# Patient Record
Sex: Female | Born: 1960 | Race: White | Hispanic: No | Smoking: Current every day smoker
Health system: Southern US, Community
[De-identification: ages and names within clinical notes are randomized; demographics above are authoritative.]

## PROBLEM LIST (undated history)

## (undated) DIAGNOSIS — F419 Anxiety disorder, unspecified: Secondary | ICD-10-CM

## (undated) DIAGNOSIS — I059 Rheumatic mitral valve disease, unspecified: Secondary | ICD-10-CM

## (undated) DIAGNOSIS — I10 Essential (primary) hypertension: Secondary | ICD-10-CM

## (undated) HISTORY — DX: Essential (primary) hypertension: I10

## (undated) HISTORY — DX: Anxiety disorder, unspecified: F41.9

## (undated) HISTORY — DX: Rheumatic mitral valve disease, unspecified: I05.9

---

## 2001-01-29 ENCOUNTER — Other Ambulatory Visit: Admission: RE | Admit: 2001-01-29 | Discharge: 2001-01-29 | Payer: Self-pay | Admitting: *Deleted

## 2013-01-16 ENCOUNTER — Encounter (HOSPITAL_COMMUNITY): Payer: Self-pay | Admitting: Emergency Medicine

## 2013-01-16 ENCOUNTER — Emergency Department (INDEPENDENT_AMBULATORY_CARE_PROVIDER_SITE_OTHER)
Admission: EM | Admit: 2013-01-16 | Discharge: 2013-01-16 | Disposition: A | Discharge status: Home / Self Care | Payer: Worker's Compensation | Source: Home / Self Care | Attending: Emergency Medicine | Admitting: Emergency Medicine

## 2013-01-16 ENCOUNTER — Emergency Department (INDEPENDENT_AMBULATORY_CARE_PROVIDER_SITE_OTHER): Payer: Worker's Compensation

## 2013-01-16 DIAGNOSIS — T148XXA Other injury of unspecified body region, initial encounter: Secondary | ICD-10-CM

## 2013-01-16 DIAGNOSIS — Z23 Encounter for immunization: Secondary | ICD-10-CM

## 2013-01-16 DIAGNOSIS — IMO0002 Reserved for concepts with insufficient information to code with codable children: Secondary | ICD-10-CM

## 2013-01-16 MED ORDER — TETANUS-DIPHTH-ACELL PERTUSSIS 5-2.5-18.5 LF-MCG/0.5 IM SUSP
0.5000 mL | Freq: Once | INTRAMUSCULAR | Status: AC
  Administered 2013-01-16: 0.5 mL via INTRAMUSCULAR

## 2013-01-16 MED ORDER — TETANUS-DIPHTH-ACELL PERTUSSIS 5-2.5-18.5 LF-MCG/0.5 IM SUSP
INTRAMUSCULAR | Status: AC
  Filled 2013-01-16: qty 0.5

## 2013-01-16 MED ORDER — HYDROCODONE-ACETAMINOPHEN 5-325 MG PO TABS: ORAL_TABLET | ORAL | Status: DC

## 2013-01-16 NOTE — ED Notes (Signed)
Pt states today around 4:25 she slammed her thumb of the right hand in the safe at work.  Pt has used ice for comfort. Some mild swelling. Aleve taken for pain.

## 2013-01-16 NOTE — ED Provider Notes (Addendum)
Chief Complaint  Patient presents with  . Finger Injury    thumb injury of right hand    History of Present Illness:   Julia Nichols is a 52 year old female, an employee of the [employer], who today at 425 at work slammed her right thumb in the door of a safe. She sustained a laceration to the dorsal inter phalangeal joint. It bled profusely and she was eventually able to get the bleeding under control. She cannot remember when her last tetanus vaccine was.  Review of Systems:  Other than noted above, the patient denies any of the following symptoms: Systemic:  No fever or chills. Musculoskeletal:  No joint pain or decreased range of motion. Neuro:  No numbness, tingling, or weakness.  PMFSH:  Past medical history, family history, social history, meds, and allergies were reviewed.  Physical Exam:   Vital signs:  BP 154/95  Pulse 84  Temp(Src) 97.9 F (36.6 C) (Oral)  Resp 18  SpO2 100% Ext:  There is a 2 cm laceration extending across the interphalangeal joint on the dorsum of the thumb. This does not involve the extensor tendon and she has full function of the extensor tendon. She has normal sensation to light touch at the tip of the thumb.  All other joints had a full ROM without pain.  Pulses were full.  Good capillary refill in all digits.  No edema. Neurological:  Alert and oriented.  No muscle weakness.  Sensation was intact to light touch.   Dg Finger Thumb Right  01/16/2013  *RADIOLOGY REPORT*  Clinical Data: Thumb injury  RIGHT THUMB 2+V  Comparison: None.  Findings: Negative for fracture.  Joint space narrowing and spurring in the first interphalangeal joint.  IMPRESSION: Negative for fracture.   Original Report Authenticated By: Janeece Riggers, M.D.     Procedure: Verbal informed consent was obtained.  The patient was informed of the risks and benefits of the procedure and understands and accepts.  Identity of the patient was verified verbally and by wristband.    The laceration area described above was prepped with Betadine and saline  and anesthetized with a digital block with 5 mL of 2% Xylocaine without epinephrine.  The wound was then closed as follows:  Wound edges were approximated with 4 simple interrupted 5-0 nylon sutures.  There were no immediate complications, and the patient tolerated the procedure well. The laceration was then cleansed, Bacitracin ointment was applied and a clean, dry pressure dressing was put on. The patient was given a splint to protect, and to keep it straight.  Medications given in [facility]:  Given a Tdap vaccine.  Assessment:  The encounter diagnosis was Laceration.  Plan:   1.  The following meds were prescribed:   New Prescriptions   HYDROCODONE-ACETAMINOPHEN (NORCO/VICODIN) 5-325 MG PER TABLET    1 to 2 tabs every 4 to 6 hours as needed for pain.   2.  The patient was instructed in wound care and pain control, and handouts were given. 3.  The patient was told to return in 14 days for suture removal or wound recheck or sooner if any sign of infection.     Reuben Likes, MD 01/16/13 1847  Reuben Likes, MD 01/16/13 [phone]

## 2016-01-20 ENCOUNTER — Encounter: Payer: Self-pay | Admitting: Primary Care

## 2016-01-20 ENCOUNTER — Ambulatory Visit (INDEPENDENT_AMBULATORY_CARE_PROVIDER_SITE_OTHER): Payer: Managed Care, Other (non HMO) | Admitting: Primary Care

## 2016-01-20 VITALS — BP 144/94 | HR 86 | Temp 98.3°F | Ht 63.0 in | Wt 137.1 lb

## 2016-01-20 DIAGNOSIS — F329 Major depressive disorder, single episode, unspecified: Secondary | ICD-10-CM | POA: Insufficient documentation

## 2016-01-20 DIAGNOSIS — G4700 Insomnia, unspecified: Secondary | ICD-10-CM

## 2016-01-20 DIAGNOSIS — M1990 Unspecified osteoarthritis, unspecified site: Secondary | ICD-10-CM | POA: Diagnosis not present

## 2016-01-20 DIAGNOSIS — F419 Anxiety disorder, unspecified: Principal | ICD-10-CM

## 2016-01-20 DIAGNOSIS — B070 Plantar wart: Secondary | ICD-10-CM | POA: Insufficient documentation

## 2016-01-20 DIAGNOSIS — F418 Other specified anxiety disorders: Secondary | ICD-10-CM | POA: Diagnosis not present

## 2016-01-20 MED ORDER — FLUOXETINE HCL 20 MG PO TABS: 20.0000 mg | ORAL_TABLET | Freq: Every day | ORAL | Status: DC

## 2016-01-20 MED ORDER — DICLOFENAC SODIUM 1 % TD GEL: 2.0000 g | Freq: Four times a day (QID) | TRANSDERMAL | Status: DC

## 2016-01-20 MED ORDER — TRAZODONE HCL 50 MG PO TABS: 25.0000 mg | ORAL_TABLET | Freq: Every evening | ORAL | Status: DC | PRN

## 2016-01-20 MED ORDER — ALPRAZOLAM 0.25 MG PO TABS: 0.2500 mg | ORAL_TABLET | Freq: Every day | ORAL | Status: DC | PRN

## 2016-01-20 NOTE — Patient Instructions (Signed)
Start Fluoxetine 20 mg tablets for anxiety and depression. Take 1/2 tablet by mouth daily for 6 days, then advance to 1 full tablet thereafter.  You may use the alprazolam tablets as needed for breakthrough anxiety. Please use this medication sparingly.   Start Trazodone tablets at bedtime for sleep. Take 1/2 to 1 full tablet by mouth 1 hour prior to bedtime.  Obtain a wart removal kit. Please notify me if no improvement in 2 weeks.  Follow up in 6 weeks for re-evaluation of anxiety, insomnia and depression.  It was a pleasure to meet you today! Please don't hesitate to call me with any questions. Welcome to [facility]!

## 2016-01-20 NOTE — Assessment & Plan Note (Signed)
Present to shoulders, fingers, toes mostly. Mild disfigurement to DIP joints of phalanges. Appointment with rheumatology scheduled for March 21st. RX for Voltaren gel provided.

## 2016-01-20 NOTE — Progress Notes (Signed)
Pre visit review using our clinic review tool, if applicable. No additional management support is needed unless otherwise documented below in the visit note. 

## 2016-01-20 NOTE — Assessment & Plan Note (Signed)
3 small, deep, warts to plantar surface of left foot. Attempted to freeze today with liquid nitrogen. Discussed OTC kits to treat. Suspect she will need to see podiatry for removal as they are quite deep. She will update me with progress of OTC treatment. Will send to podiatry if no improvement.

## 2016-01-20 NOTE — Assessment & Plan Note (Signed)
Long history of in the past and once managed on Prozac and alprazolam. PHQ 9 score of 16 and GAD 7 score of 7 today. Very anxious during exam today.  Started Prozac 20 mg tablets today. Patient is to take 1/2 tablet daily for 6 days, then advance to 1 full tablet thereafter. We discussed possible side effects of headache, GI upset, drowsiness, and SI/HI. If thoughts of SI/HI develop, we discussed to present to the emergency immediately. Patient verbalized understanding.   Also provided short term RX for alprazolam until Prozac can reach effect. Discussed that this was very short term, she verbalized understanding.  Follow up in 6 weeks for re-evaluation.

## 2016-01-20 NOTE — Assessment & Plan Note (Signed)
Long history of. Once managed on Valium? Will start low dose Trazodone. Follow up in 6 weeks.

## 2016-01-20 NOTE — Progress Notes (Signed)
Subjective:    Patient ID: Julia Nichols, female    DOB: 09-Jul-1961, 55 y.o.   MRN: [id number]  HPI  Julia Nichols is a 55 year old female who presents today to establish care and discuss the problems mentioned below. Will obtain old records. Her last physical was years ago.   1) Depression: Symptoms present intermittently her entire life. She was once managed on Xanax, Paxil, and Prozac in the past. She ran out of her Prozac years ago due to loss of insurance, but did feel well managed on the Prozac. She's noticed an increase in symptoms since the passing of her father in October 2016. She's feeling overwhelmed and very anxious in regards to her responsibility with her father's estate. Denies SI/HI. PHQ 9 score of 16. GAD 7 score of 7 (although based off of her interview is likely higher).  2) Insomnia: Present all of her life. She was once managed on Valium and Xanax for sleep in the past with improvement. She's tried OTC sleep aids with some improvement. Difficulty falling and staying asleep.   3) Arthtis: Present for years. Mostly located to shoulders. She's noticed swelling and disfiguement to her fingers and toes most recently. She has an appointment with a rheumatologist on March 21st. She is requesting a prescription for Voltaren gel as this once helped with discomfort in the past.  4) Warts: Located to left planar foot for the past several months. Once present to right plantar surface with spontaneous resolve.  Overall she's noticed an increase in size in the lesions and pain. 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 She's not tried any OTC treatment.  Review of Systems  Constitutional: Negative for unexpected weight change.  HENT: Negative for rhinorrhea.   Respiratory: Negative for cough and shortness of breath.   Cardiovascular: Negative for chest pain.  Genitourinary:       Postmenopausal.   Musculoskeletal: Positive for arthralgias.  Skin:         Warts  Allergic/Immunologic: Negative for environmental allergies.  Neurological: Negative for dizziness.  Psychiatric/Behavioral: Positive for sleep disturbance. The patient is nervous/anxious.        No past medical history on file.  Social History   Social History  . Marital Status: Married    Spouse Name: N/A  . Number of Children: N/A  . Years of Education: N/A   Occupational History  . Not on file.   Social History Main Topics  . Smoking status: Current Every Day Smoker -- 0.75 packs/day    Types: Cigarettes  . Smokeless tobacco: Not on file  . Alcohol Use: 0.0 oz/week    0 Standard drinks or equivalent per week  . Drug Use: No  . Sexual Activity: Yes    Birth Control/ Protection: None   Other Topics Concern  . Not on file   Social History Narrative    No past surgical history on file.  No family history on file.  No Known Allergies  No current outpatient prescriptions on file prior to visit.   No current facility-administered medications on file prior to visit.    BP 144/94 mmHg  Pulse 86  Temp(Src) 98.3 F (36.8 C) (Oral)  Ht 5\' 3"  (1.6 m)  Wt 137 lb 1.9 oz (62.197 kg)  BMI 24.30 kg/m2  SpO2 94%    Objective:   Physical Exam  Constitutional: She is oriented to person, place, and time. She appears well-nourished.  Neck: Neck supple.  Cardiovascular: Normal rate and regular rhythm.   Pulmonary/Chest: Effort normal and breath sounds normal.  Musculoskeletal:  Mild disfigurement to phalanges of bilateral hands, more so to DIP joints.   Neurological: She is alert and oriented to person, place, and time.  Skin: Skin is warm and dry.  3 small, deep, plantar warts to plantar surface of left foot.  Psychiatric:  Very anxious during exam. Cooperative.          Assessment & Plan:  >45 minutes spent face to face with patient, >50% spent counseling or coordinating care.

## 2016-03-06 ENCOUNTER — Ambulatory Visit: Payer: Self-pay | Admitting: Primary Care

## 2016-04-04 ENCOUNTER — Ambulatory Visit: Payer: Self-pay | Admitting: Primary Care

## 2016-06-07 ENCOUNTER — Ambulatory Visit (INDEPENDENT_AMBULATORY_CARE_PROVIDER_SITE_OTHER): Payer: Managed Care, Other (non HMO) | Admitting: Primary Care

## 2016-06-07 ENCOUNTER — Encounter: Payer: Self-pay | Admitting: Primary Care

## 2016-06-07 VITALS — BP 136/88 | HR 83 | Temp 98.4°F | Ht 63.0 in | Wt 136.0 lb

## 2016-06-07 DIAGNOSIS — G4700 Insomnia, unspecified: Secondary | ICD-10-CM | POA: Diagnosis not present

## 2016-06-07 DIAGNOSIS — F418 Other specified anxiety disorders: Secondary | ICD-10-CM

## 2016-06-07 DIAGNOSIS — F419 Anxiety disorder, unspecified: Principal | ICD-10-CM

## 2016-06-07 DIAGNOSIS — M5441 Lumbago with sciatica, right side: Secondary | ICD-10-CM | POA: Diagnosis not present

## 2016-06-07 DIAGNOSIS — F329 Major depressive disorder, single episode, unspecified: Secondary | ICD-10-CM

## 2016-06-07 DIAGNOSIS — M1990 Unspecified osteoarthritis, unspecified site: Secondary | ICD-10-CM

## 2016-06-07 MED ORDER — PREDNISONE 10 MG PO TABS: ORAL_TABLET | ORAL | 0 refills | Status: DC

## 2016-06-07 MED ORDER — DULOXETINE HCL 30 MG PO CPEP: 30.0000 mg | ORAL_CAPSULE | Freq: Every day | ORAL | 2 refills | Status: DC

## 2016-06-07 MED ORDER — TRAZODONE HCL 50 MG PO TABS: 25.0000 mg | ORAL_TABLET | Freq: Every evening | ORAL | 1 refills | Status: DC | PRN

## 2016-06-07 NOTE — Progress Notes (Signed)
Subjective:    Patient ID: Julia Nichols, female    DOB: [birth year], 55 y.o.   MRN: [id number]  HPI  Julia Nichols is a 55 year old female with a history who presents today with a chief complaint of back and for follow up.  1) Back Pain: Located to her right lower back that has been present since Friday last week after lifting a heavy box at work. She describes her pain as a dull and achy with intermittent radiation of pain to her right lower extremity. She's also experienced several episodes of numbness to her right lower extremity. She's taken advil and tylenol with some improvement.   2) Anxiety and Depression: Long history of in the past. Once managed on Prozac and alprazolam. She was restarted on Prozac 20 mg tablets in March 2017, never came back for follow up as recommended. Also discussed that Alprazolam was no longer to be prescribed after a short term refill to allow Prozac to take effect.  Since her last visit she stopped taking the Prozac toward the end of May 2017 as she didn't feel as though it was effective. She believes the Prozac caused her to feel more angry. She's noticed daily anxiety, worry, irritability, difficulty sleeping. She would like to try something else for symptoms.  3) Insomnia: Previously managed on Valium PRN by prior PCP. She was initiated on Trazodone during her visit in March 2017. Since the initiation of trazodone she's sleeping well. She is requesting a refill today.  Review of Systems  Respiratory: Negative for shortness of breath.   Cardiovascular: Negative for chest pain.  Musculoskeletal: Positive for back pain.  Neurological: Positive for numbness. Negative for headaches.  Psychiatric/Behavioral: Negative for sleep disturbance and suicidal ideas. The patient is nervous/anxious.        No past medical history on file.   Social History   Social History  . Marital status: Married    Spouse name: N/A  . Number of children: N/A  . Years of  education: N/A   Occupational History  . Not on file.   Social History Main Topics  . Smoking status: Current Every Day Smoker    Packs/day: 0.75    Types: Cigarettes  . Smokeless tobacco: Not on file  . Alcohol use 0.0 oz/week  . Drug use: No  . Sexual activity: Yes    Birth control/ protection: None   Other Topics Concern  . Not on file   Social History Narrative  . No narrative on file    No past surgical history on file.  No family history on file.  No Known Allergies  Current Outpatient Prescriptions on File Prior to Visit  Medication Sig Dispense Refill  . aspirin 81 MG tablet Take 81 mg by mouth daily.    . diclofenac sodium (VOLTAREN) 1 % GEL Apply 2 g topically 4 (four) times daily. 1 Tube 0  . Multiple Vitamins-Minerals (CENTRUM SILVER PO) Take 1 tablet by mouth.     No current facility-administered medications on file prior to visit.     BP 136/88 (BP Location: Left Arm, Patient Position: Sitting, Cuff Size: Normal)   Pulse 83   Temp 98.4 F (36.9 C) (Oral)   Ht 5\' 3"  (1.6 m)   Wt 136 lb (61.7 kg)   SpO2 96%   BMI 24.09 kg/m    Objective:   Physical Exam  Constitutional: She appears well-nourished.  Neck: Neck supple.  Cardiovascular: Normal rate and regular rhythm.  Pulmonary/Chest: Effort normal and breath sounds normal.  Musculoskeletal:       Lumbar back: She exhibits decreased range of motion and pain. She exhibits no tenderness.  Negative straight leg raise bilaterally  Skin: Skin is warm and dry.          Assessment & Plan:  Low back pain:  Located to right lower back since Friday after lifting a heavy box at work. Symptoms of sciatica with some numbness to right lower extremity. Exam today without point tenderness but obvious discomfort to right lower side. Negative straight leg raise bilaterally. Given presence of radiculopathy will initiate low-dose prednisone taper, hold Advil. Application of heat and rest advised. Work  note provided for her to remain out the rest of this week to allow her to recover. Follow-up as needed.  Morrie Sheldon, NP

## 2016-06-07 NOTE — Assessment & Plan Note (Signed)
Much improved with trazodone initiation in March 2017. Refill provided today.

## 2016-06-07 NOTE — Patient Instructions (Signed)
Start prednisone tablets. Take three tablets for 2 days, then two tablets for 2 days, then one tablet for 2 days.   Apply heat to your back and try not to over exert yourself. Do not lay sedentary as this could cause increased stiffness and discomfort.  Start Cymbalta capsules for anxiety and depression. Take 1 capsule by mouth every morning.  I sent refills for your Trazodone to the pharmacy.  Follow up in 2 months for re-evaluation of anxiety and depression.   It was a pleasure to see you today!   Back Pain, Adult Back pain is very common in adults.The cause of back pain is rarely dangerous and the pain often gets better over time.The cause of your back pain may not be known. Some common causes of back pain include:  Strain of the muscles or ligaments supporting the spine.  Wear and tear (degeneration) of the spinal disks.  Arthritis.  Direct injury to the back. For many people, back pain may return. Since back pain is rarely dangerous, most people can learn to manage this condition on their own. HOME CARE INSTRUCTIONS Watch your back pain for any changes. The following actions may help to lessen any discomfort you are feeling:  Remain active. It is stressful on your back to sit or stand in one place for long periods of time. Do not sit, drive, or stand in one place for more than 30 minutes at a time. Take short walks on even surfaces as soon as you are able.Try to increase the length of time you walk each day.  Exercise regularly as directed by your health care provider. Exercise helps your back heal faster. It also helps avoid future injury by keeping your muscles strong and flexible.  Do not stay in bed.Resting more than 1-2 days can delay your recovery.  Pay attention to your body when you bend and lift. The most comfortable positions are those that put less stress on your recovering back. Always use proper lifting techniques, including:  Bending your knees.  Keeping  the load close to your body.  Avoiding twisting.  Find a comfortable position to sleep. Use a firm mattress and lie on your side with your knees slightly bent. If you lie on your back, put a pillow under your knees.  Avoid feeling anxious or stressed.Stress increases muscle tension and can worsen back pain.It is important to recognize when you are anxious or stressed and learn ways to manage it, such as with exercise.  Take medicines only as directed by your health care provider. Over-the-counter medicines to reduce pain and inflammation are often the most helpful.Your health care provider may prescribe muscle relaxant drugs.These medicines help dull your pain so you can more quickly return to your normal activities and healthy exercise.  Apply ice to the injured area:  Put ice in a plastic bag.  Place a towel between your skin and the bag.  Leave the ice on for 20 minutes, 2-3 times a day for the first 2-3 days. After that, ice and heat may be alternated to reduce pain and spasms.  Maintain a healthy weight. Excess weight puts extra stress on your back and makes it difficult to maintain good posture. SEEK MEDICAL CARE IF:  You have pain that is not relieved with rest or medicine.  You have increasing pain going down into the legs or buttocks.  You have pain that does not improve in one week.  You have night pain.  You lose weight.  You have a fever or chills. SEEK IMMEDIATE MEDICAL CARE IF:   You develop new bowel or bladder control problems.  You have unusual weakness or numbness in your arms or legs.  You develop nausea or vomiting.  You develop abdominal pain.  You feel faint.   This information is not intended to replace advice given to you by your health care provider. Make sure you discuss any questions you have with your health care provider.   Document Released: 10/30/2005 Document Revised: 11/20/2014 Document Reviewed: 03/03/2014 Elsevier Interactive  Patient Education [employer]

## 2016-06-07 NOTE — Assessment & Plan Note (Signed)
Chronic. Will start low-dose Cymbalta for anxiety and depression in hopes of improving her arthritic pain.

## 2016-06-07 NOTE — Assessment & Plan Note (Signed)
Restarted on Prozac in March 2017, patient never followed up as requested. Stop taking Prozac in May 17 as she did not feel it to be working. Continues with daily worry, anxiety, irritability. Will start low-dose Cymbalta today as she also experiences osteoarthritic pain. Discussed possible side effects with patient including suicidal thoughts. Follow-up in 2 months for reevaluation.

## 2016-06-07 NOTE — Progress Notes (Signed)
Pre visit review using our clinic review tool, if applicable. No additional management support is needed unless otherwise documented below in the visit note. 

## 2016-10-18 ENCOUNTER — Encounter: Payer: Self-pay | Admitting: Primary Care

## 2016-10-18 ENCOUNTER — Ambulatory Visit (INDEPENDENT_AMBULATORY_CARE_PROVIDER_SITE_OTHER): Payer: Managed Care, Other (non HMO) | Admitting: Primary Care

## 2016-10-18 ENCOUNTER — Ambulatory Visit (INDEPENDENT_AMBULATORY_CARE_PROVIDER_SITE_OTHER)
Admission: RE | Admit: 2016-10-18 | Discharge: 2016-10-18 | Disposition: A | Discharge status: Home / Self Care | Payer: Managed Care, Other (non HMO) | Source: Ambulatory Visit | Attending: Primary Care | Admitting: Primary Care

## 2016-10-18 VITALS — BP 176/104 | HR 86 | Temp 98.2°F | Ht 63.0 in | Wt 138.8 lb

## 2016-10-18 DIAGNOSIS — M542 Cervicalgia: Secondary | ICD-10-CM | POA: Diagnosis not present

## 2016-10-18 MED ORDER — METHOCARBAMOL 500 MG PO TABS: 500.0000 mg | ORAL_TABLET | Freq: Four times a day (QID) | ORAL | 0 refills | Status: DC | PRN

## 2016-10-18 MED ORDER — PREDNISONE 10 MG PO TABS: ORAL_TABLET | ORAL | 0 refills | Status: DC

## 2016-10-18 NOTE — Progress Notes (Signed)
Subjective:    Patient ID: Julia Nichols, female    DOB: [birth year], 55 y.o.   MRN: [id number]  HPI  Ms. Julia Nichols is a 55 year old female with a history of osteoarthritis who presents today with a chief complaint of neck pain. She also reports shoulder pain. Her pain is located to the left lateral neck with pain moving to her left posterior back and left shoulder. She describes her pain as sore and her neck has been very stiff. She's had difficulty moving her neck to the left. She denies recent injury/trauma. 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 She has noticed numbness and tingling to her neck with radiation down to her left shoulder. She's applied the Voltaren gel and Advil with some improvement.  She was prescribed Cymbalta for anxiety and depression, and also to help with arthritic pain, but has not taken this in several months as it caused her to feel angry. She has not had recent imaging.  Review of Systems  Constitutional: Negative for fever.  Musculoskeletal: Positive for arthralgias, myalgias, neck pain and neck stiffness.  Neurological: Positive for numbness.       No past medical history on file.   Social History   Social History  . Marital status: Married    Spouse name: N/A  . Number of children: N/A  . Years of education: N/A   Occupational History  . Not on file.   Social History Main Topics  . Smoking status: Current Every Day Smoker    Packs/day: 0.75    Types: Cigarettes  . Smokeless tobacco: Not on file  . Alcohol use 0.0 oz/week  . Drug use: No  . Sexual activity: Yes    Birth control/ protection: None   Other Topics Concern  . Not on file   Social History Narrative  . No narrative on file    No past surgical history on file.  No family history on file.  No Known Allergies  Current Outpatient Prescriptions on File Prior to Visit  Medication Sig Dispense Refill  . aspirin 81 MG tablet Take 81 mg by mouth daily.    .  diclofenac sodium (VOLTAREN) 1 % GEL Apply 2 g topically 4 (four) times daily. 1 Tube 0  . DULoxetine (CYMBALTA) 30 MG capsule Take 1 capsule (30 mg total) by mouth daily. (Patient not taking: Reported on 10/18/2016) 30 capsule 2  . Multiple Vitamins-Minerals (CENTRUM SILVER PO) Take 1 tablet by mouth.    . traZODone (DESYREL) 50 MG tablet Take 0.5-1 tablets (25-50 mg total) by mouth at bedtime as needed for sleep. (Patient not taking: Reported on 10/18/2016) 90 tablet 1   No current facility-administered medications on file prior to visit.     BP (!) 176/104   Pulse 86   Temp 98.2 F (36.8 C) (Oral)   Ht 5\' 3"  (1.6 m)   Wt 138 lb 12.8 oz (63 kg)   SpO2 98%   BMI 24.59 kg/m    Objective:   Physical Exam  Constitutional: She appears well-nourished.  Neck: Neck supple. Muscular tenderness present. No spinous process tenderness present. Decreased range of motion present.  Decreased ROM with left rotation, flexion, and extension.  Cardiovascular: Normal rate and regular rhythm.   Pulmonary/Chest: Effort normal and breath sounds normal.  Musculoskeletal:       Left shoulder: She exhibits decreased range of motion and spasm. She exhibits normal strength.  Skin: Skin is warm and dry.  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 Has been lifting more than usual given the Holiday season. Exam today with evidence of muscle spasm to left neck and posterior back. Suspect involvement to nerves given numbness/tingling. Rx for methocarbamol and prednisone provided for acute relief. Discussed application of heating pad and stretching.  Work note provided for her to rest and return to work next week. Xray of cervical spine with evidence of degenerative disc disease with spurring.  Morrie Sheldonlark,Katherine Kendal, NP  Follow up PRN.

## 2016-10-18 NOTE — Progress Notes (Signed)
Pre visit review using our clinic review tool, if applicable. No additional management support is needed unless otherwise documented below in the visit note. 

## 2016-10-18 NOTE — Patient Instructions (Signed)
Complete xray(s) prior to leaving today. I will notify you of your results once received.  Start prednisone tablets. Take three tablets for 2 days, then two tablets for 2 days, then one tablet for 2 days.  You may take methocarbamol (Robaxin) tablets as needed for muscle spasms. Take 1 tablet by mouth every 6 hours as needed. Caution as this medication may cause drowsiness.  Apply a heating pad three times daily to your neck for 30 minutes at a time.  It was a pleasure to see you today!

## 2017-01-05 ENCOUNTER — Telehealth: Payer: Self-pay

## 2017-01-05 DIAGNOSIS — G4700 Insomnia, unspecified: Secondary | ICD-10-CM

## 2017-01-05 DIAGNOSIS — F419 Anxiety disorder, unspecified: Principal | ICD-10-CM

## 2017-01-05 DIAGNOSIS — F329 Major depressive disorder, single episode, unspecified: Secondary | ICD-10-CM

## 2017-01-05 NOTE — Telephone Encounter (Signed)
Spoke with patient regarding concerns and discussed xray results. She is not on antidepressants as she does not feel depressed. She's not taking anything to help her sleep. She's doing well overall and her neck pain hasn't bothered her. I updated her medication list. She would like this information sent over to her insurance company, please contact patient on Monday 02/26 for details.

## 2017-01-05 NOTE — Assessment & Plan Note (Signed)
Doing well, not using any prescription medications for sleep.

## 2017-01-05 NOTE — Telephone Encounter (Signed)
Pt left v/m requesting cb about results of xrays done first of Dec. Pt is applying for early retirement and was advised had vertebrae problems in neck. Pt wants to know what is going on with neck and wants results from xray done in Dec. Left v/m requesting pt to cb.

## 2017-01-05 NOTE — Telephone Encounter (Addendum)
Pt called back and was hurt at work and pt is trying to retire early and applying for independent ins and found out you have a problem with neck; pt did not get a call and pt did not read mychart. Pt request cb from Mayra ReelKate Clark NP because she was surprised to know that she had a problem with her neck and no one called her. Several times I tried to explain that the results were in  [place]. Pt request to speak with Mayra ReelKate Clark NP. If cb on Monday please call after 3:30 pm.

## 2017-01-05 NOTE — Assessment & Plan Note (Signed)
Improved and is off of all medications. She's doing well now and denies concerns for anxiety or depression.

## 2017-01-09 NOTE — Telephone Encounter (Signed)
Tried to call patient yesterday on 01/08/2017. Bad connection so will call back later.  Called patient again today on 01/09/2017. Patient stated still had bad area for phone signal. She wants me to call her later today.

## 2017-01-09 NOTE — Telephone Encounter (Signed)
Message left for patient to return my call.  

## 2017-01-30 NOTE — Telephone Encounter (Signed)
Message left for patient to return my call.  

## 2017-01-30 NOTE — Telephone Encounter (Signed)
Patient returned Chan's call.  Patient can be reached at [phone].

## 2017-02-06 NOTE — Telephone Encounter (Signed)
Patient called back. We having been playing phone tags for a while now.   The reason that she called is because she want to retire early. She has been applying to several insurance companies for coverage. However, because of the medical records that the insurance companies obtain here, she has been denied. Patient stated is there way we can make changes so she can get approval. Confirm with patient that I'm not sure what to do about this because patient does have arthritis. Patient was seen for anxiety and depression due to the passing of her father then 7 months later the passing of her husband.  Also patient stated to come to the office is costing her $245 per visit so she needs to know what can she do.

## 2017-02-06 NOTE — Telephone Encounter (Signed)
There seems to be a miscommunication somewhere. I will not change my documentation from her prior visits.  Rose, can you contact this patient to see what's going on? Is there a billing problem? Can you help? Thanks!

## 2017-02-13 ENCOUNTER — Encounter: Payer: Self-pay | Admitting: Primary Care

## 2017-02-13 ENCOUNTER — Ambulatory Visit (INDEPENDENT_AMBULATORY_CARE_PROVIDER_SITE_OTHER): Payer: Managed Care, Other (non HMO) | Admitting: Primary Care

## 2017-02-13 VITALS — BP 166/100 | HR 95 | Temp 98.4°F | Ht 63.0 in | Wt 143.4 lb

## 2017-02-13 DIAGNOSIS — B070 Plantar wart: Secondary | ICD-10-CM

## 2017-02-13 DIAGNOSIS — M1990 Unspecified osteoarthritis, unspecified site: Secondary | ICD-10-CM | POA: Diagnosis not present

## 2017-02-13 NOTE — Patient Instructions (Addendum)
Your blood pressure is too high and requires treatment with medication. Please notify me when you are ready for treatment of your high blood pressure.  Please notify me if there is no improvement to your warts after treatment today.  It was a pleasure to see you today!

## 2017-02-13 NOTE — Progress Notes (Signed)
Subjective:    Patient ID: Julia Nichols, female    DOB: Apr 12, 1961, 56 y.o.   MRN: [id number]  HPI  Ms. Diguglielmo is a 56 year old female with a chief complaint of plantar warts. She has a history of plantar warts to surface of left plantar foot that were treated with liquid nitrogen in 01/2016.  Over the past six month ago she noticed three additional warts to the left foot. The last episode of warts did resolve. She's attempted to self treat by trimming the skin around her warts and also tried OTC wart remover pads without much improvement.   2) History of Acute Back Pain: Evaluated in July 2017 with complaints of acute right lower back pain that occurred after lifting a heavy box at work. She is currently applying for life insurance and believes she was denied given history of this acute event. Today she denies any low back pain and numbness to her right lower extremity. This resolved within several weeks after occurrence of the event and has had no problem since.   3) History of Acute Neck Pain: History of osteoarthritis. Presented in December 2017 with acute neck pain to left lateral neck with radiation to left shoulder. This occurred after recurrent lifting of heavy packages given the holiday season (works for [employer]). She underwent xray that day and was provided with a muscle relaxer and prednisone course for treatment.   She is currently applying for life insurance and believes she was denied given history of this acute event. She denies any neck or shoulder pain since she was treated in December 2017. She is doing well.    Review of Systems  Musculoskeletal: Negative for arthralgias, back pain and neck pain.  Skin:       Plantar warts  Neurological: Negative for numbness.       No past medical history on file.   Social History   Social History  . Marital status: Married    Spouse name: N/A  . Number of children: N/A  . Years of education: N/A   Occupational History  . Not  on file.   Social History Main Topics  . Smoking status: Current Every Day Smoker    Packs/day: 0.75    Types: Cigarettes  . Smokeless tobacco: Never Used  . Alcohol use 0.0 oz/week  . Drug use: No  . Sexual activity: Yes    Birth control/ protection: None   Other Topics Concern  . Not on file   Social History Narrative  . No narrative on file    No past surgical history on file.  No family history on file.  No Known Allergies  Current Outpatient Prescriptions on File Prior to Visit  Medication Sig Dispense Refill  . aspirin 81 MG tablet Take 81 mg by mouth daily.    . diclofenac sodium (VOLTAREN) 1 % GEL Apply 2 g topically 4 (four) times daily. 1 Tube 0  . Multiple Vitamins-Minerals (CENTRUM SILVER PO) Take 1 tablet by mouth.     No current facility-administered medications on file prior to visit.     BP (!) 166/100   Pulse 95   Temp 98.4 F (36.9 C) (Oral)   Ht  (1.6 m)   Wt 143 lb 6.4 oz (65 kg)   SpO2 98%   BMI 25.40 kg/m    Objective:   Physical Exam  Constitutional: She appears well-nourished.  Neck: Neck supple.  Cardiovascular: Normal rate and regular rhythm.  Pulmonary/Chest: Effort normal and breath sounds normal.  Skin: Skin is warm and dry.  Three small lesions to plantar surface of distal foot representative of plantar warts. Non tender. Two superficial, one deep.          Assessment & Plan:

## 2017-02-13 NOTE — Assessment & Plan Note (Signed)
Overall doing well, denies neck, shoulder, back pain.

## 2017-02-13 NOTE — Progress Notes (Signed)
Pre visit review using our clinic review tool, if applicable. No additional management support is needed unless otherwise documented below in the visit note. 

## 2017-02-13 NOTE — Assessment & Plan Note (Signed)
Three new plantar warts to left foot.  Treated today with liquid nitrogen as this was successful in the past. Patient tolerated well. Discussed follow up PRN if no resolve.

## 2017-02-19 NOTE — Telephone Encounter (Signed)
Hi Julia Nichols,  I have reviewed this account and Julia Nichols doesn't have a billing issue.  Apparently the insurance companies she's applied to have either denied her or charged higher premiums due to her diagnosis of anxiety and/or arthritis.  I bet she's being charged higher premiums due to her dx, not being denied.  As you previously stated, you can't change your documentation.  I'm sorry, but I'm not sure what else can be done for her.  Thanks!

## 2017-02-28 ENCOUNTER — Ambulatory Visit (INDEPENDENT_AMBULATORY_CARE_PROVIDER_SITE_OTHER): Payer: Managed Care, Other (non HMO) | Admitting: Primary Care

## 2017-02-28 ENCOUNTER — Encounter: Payer: Self-pay | Admitting: Primary Care

## 2017-02-28 VITALS — BP 146/98 | HR 89 | Temp 98.4°F | Ht 63.0 in | Wt 139.1 lb

## 2017-02-28 DIAGNOSIS — J069 Acute upper respiratory infection, unspecified: Secondary | ICD-10-CM

## 2017-02-28 MED ORDER — ALBUTEROL SULFATE HFA 108 (90 BASE) MCG/ACT IN AERS: 2.0000 | INHALATION_SPRAY | RESPIRATORY_TRACT | 0 refills | Status: AC | PRN

## 2017-02-28 MED ORDER — AZITHROMYCIN 250 MG PO TABS: ORAL_TABLET | ORAL | 0 refills | Status: DC

## 2017-02-28 MED ORDER — BENZONATATE 200 MG PO CAPS: 200.0000 mg | ORAL_CAPSULE | Freq: Three times a day (TID) | ORAL | 0 refills | Status: DC | PRN

## 2017-02-28 NOTE — Patient Instructions (Addendum)
Start Azithromycin antibiotics. Take 2 tablets by mouth today, then 1 tablet daily for 4 additional days.  You may take Benzonatate capsules for cough. Take 1 capsule by mouth three times daily as needed for cough.  Shortness of Breath/Wheezing/Cough: Use the albuterol inhaler. Inhale 2 puffs into the lungs every 4-6 hours as needed for wheezing and/or shortness of breath.   It was a pleasure to see you today!

## 2017-02-28 NOTE — Progress Notes (Signed)
Pre visit review using our clinic review tool, if applicable. No additional management support is needed unless otherwise documented below in the visit note. 

## 2017-02-28 NOTE — Progress Notes (Signed)
   Subjective:    Patient ID: Julia Nichols, female    DOB: [birth year], 56 y.o.   MRN: [id number]  HPI  Ms. Crocker is a 56 year old female who presents today with a chief complaint of cough. She also reports chest congestion. Her symptoms began 11 days ago. Her cough is dry, she's had some shortness of breath. She denies fevers, facial pain/pressure, some fatigue. She's been taking Robitussin DM and Benadryl without much improvement. Overall she's not feeling improved. She's been around numerous co-works that have had the same symptoms.  Review of Systems  Constitutional: Positive for fatigue. Negative for chills and fever.  HENT: Negative for congestion, sinus pressure and sore throat.   Respiratory: Positive for cough and shortness of breath. Negative for wheezing.        No past medical history on file.   Social History   Social History  . Marital status: Married    Spouse name: N/A  . Number of children: N/A  . Years of education: N/A   Occupational History  . Not on file.   Social History Main Topics  . Smoking status: Current Every Day Smoker    Packs/day: 0.75    Types: Cigarettes  . Smokeless tobacco: Never Used  . Alcohol use 0.0 oz/week  . Drug use: No  . Sexual activity: Yes    Birth control/ protection: None   Other Topics Concern  . Not on file   Social History Narrative  . No narrative on file    No past surgical history on file.  No family history on file.  No Known Allergies  Current Outpatient Prescriptions on File Prior to Visit  Medication Sig Dispense Refill  . aspirin 81 MG tablet Take 81 mg by mouth daily.    . diclofenac sodium (VOLTAREN) 1 % GEL Apply 2 g topically 4 (four) times daily. 1 Tube 0  . Multiple Vitamins-Minerals (CENTRUM SILVER PO) Take 1 tablet by mouth.     No current facility-administered medications on file prior to visit.     BP (!) 146/98   Pulse 89   Temp 98.4 F (36.9 C) (Oral)   Ht  (1.6 m)   Wt 139  lb 1.9 oz (63.1 kg)   SpO2 95%   BMI 24.64 kg/m    Objective:   Physical Exam  Constitutional: She appears well-nourished. She does not appear ill.  HENT:  Right Ear: Tympanic membrane and ear canal normal.  Left Ear: Tympanic membrane and ear canal normal.  Nose: Right sinus exhibits no maxillary sinus tenderness and no frontal sinus tenderness. Left sinus exhibits no maxillary sinus tenderness and no frontal sinus tenderness.  Mouth/Throat: Oropharynx is clear and moist.  Eyes: Conjunctivae are normal.  Neck: Neck supple.  Cardiovascular: Normal rate and regular rhythm.   Pulmonary/Chest: Effort normal. She has no wheezes. She has rhonchi in the right upper field and the left upper field. She has no rales.  Lymphadenopathy:    She has no cervical adenopathy.  Skin: Skin is warm and dry.          Assessment & Plan:  URI:  Cough, congestion, fatigue x 11 days. Overall no improvement with OTC treatment. Exam today with mild rhonchi as noted. She doesn't appear her usual self. Considering presentation, duration of symptoms, and exam will treat. Rx for Zpak, Tessalon Pearls, albuterol inhaler sent to pharmacy. Fluids, rest, follow up PRN.  Morrie Sheldon, NP

## 2017-03-02 ENCOUNTER — Telehealth: Payer: Self-pay | Admitting: *Deleted

## 2017-03-09 ENCOUNTER — Encounter: Payer: Self-pay | Admitting: Primary Care

## 2017-03-09 NOTE — Telephone Encounter (Signed)
Received a faxed letter from Ferd Hibbs on behalf of [facility]. Will place in Kate's inbox to review.

## 2017-03-09 NOTE — Telephone Encounter (Signed)
Noted, letter completed and placed in Chan's inbox. 

## 2017-03-09 NOTE — Telephone Encounter (Signed)
Letter has bee faxed to [facility] at [phone]

## 2017-03-13 ENCOUNTER — Telehealth: Payer: Self-pay

## 2017-03-13 DIAGNOSIS — J069 Acute upper respiratory infection, unspecified: Secondary | ICD-10-CM

## 2017-03-13 NOTE — Telephone Encounter (Signed)
She was treated with antibiotics during her visit nearly 2 weeks ago. If she feeling any better? What is her main concern? Cough? I'm happy to send and something for her cough but at this point she should not need any additional antibiotics unless she is getting worse. Please let me know.

## 2017-03-13 NOTE — Telephone Encounter (Signed)
Pt last seen 02/28/17; pt still has chest congestion; prod cough with light yellow phlegm; SOB upon exertion. No wheezing, fever or CP. Pt wants to know if med could be called in without pt being seen. Pt request cb. [facility] [place].

## 2017-03-15 MED ORDER — BENZONATATE 200 MG PO CAPS: 200.0000 mg | ORAL_CAPSULE | Freq: Three times a day (TID) | ORAL | 0 refills | Status: DC | PRN

## 2017-03-15 NOTE — Telephone Encounter (Signed)
Please notify patient that I sent in a prescription for tessalon pearls. Take 1 capsule by mouth 3 times daily as needed for cough. She may use the albuterol as needed for cough.

## 2017-03-15 NOTE — Telephone Encounter (Signed)
Spoken and notified patient of Kate's comments.  Patient stated that she is much better. However, she cannot get rid of this cough. The cough can be productive at sometimes, it is most clear but at time yellowish. Patient would like something for the cough.

## 2017-03-16 NOTE — Telephone Encounter (Signed)
Spoken and notified patient of Kate's comments. Patient verbalized understanding.  Patient is not feeling any better. She is getting worse. Notified patient to try the tessalon pearls. If she is not better she may go to urgent care over the weekend or call us on Monday to be seen in the office.

## 2017-03-16 NOTE — Telephone Encounter (Signed)
Pt returned your call. please call back. Thanks  °

## 2017-03-16 NOTE — Telephone Encounter (Signed)
Message left for patient to return my call.  

## 2018-01-24 IMAGING — DX DG CERVICAL SPINE COMPLETE 4+V
5 series · 5 of 5 positions shown · non-contrast
Comparison: None.

CLINICAL DATA: Neck pain for 1 week.

EXAM:
CERVICAL SPINE - COMPLETE 4+ VIEW

[c-spine lat]
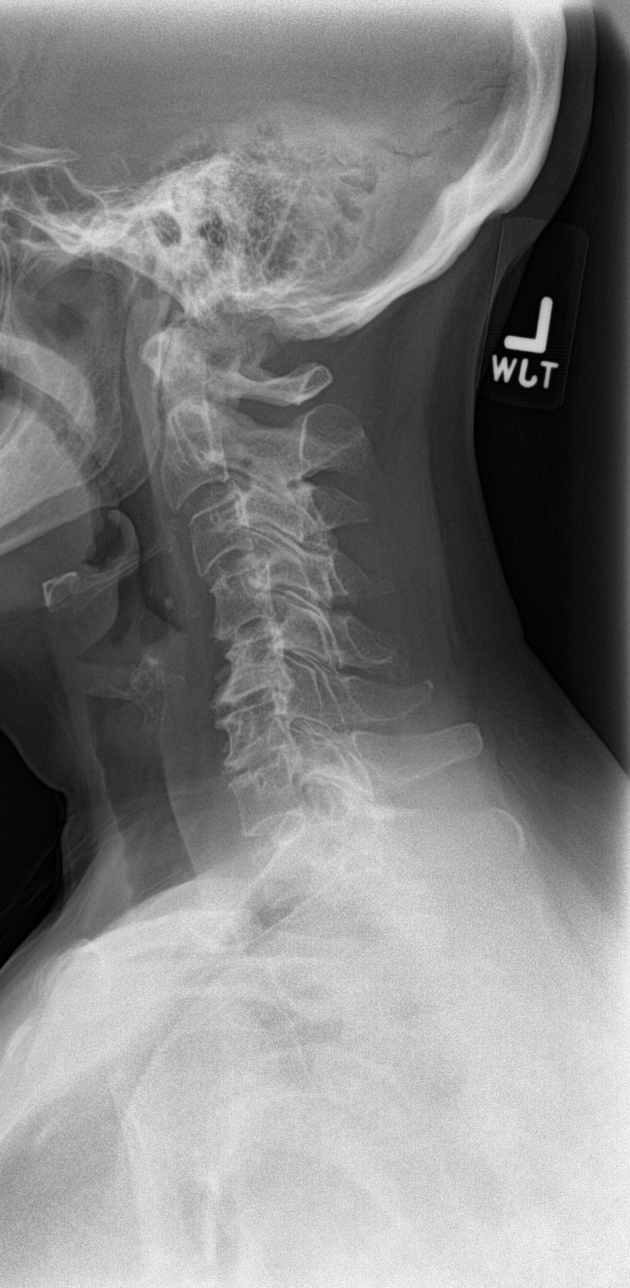

[c-spine obl (1 of 2)]
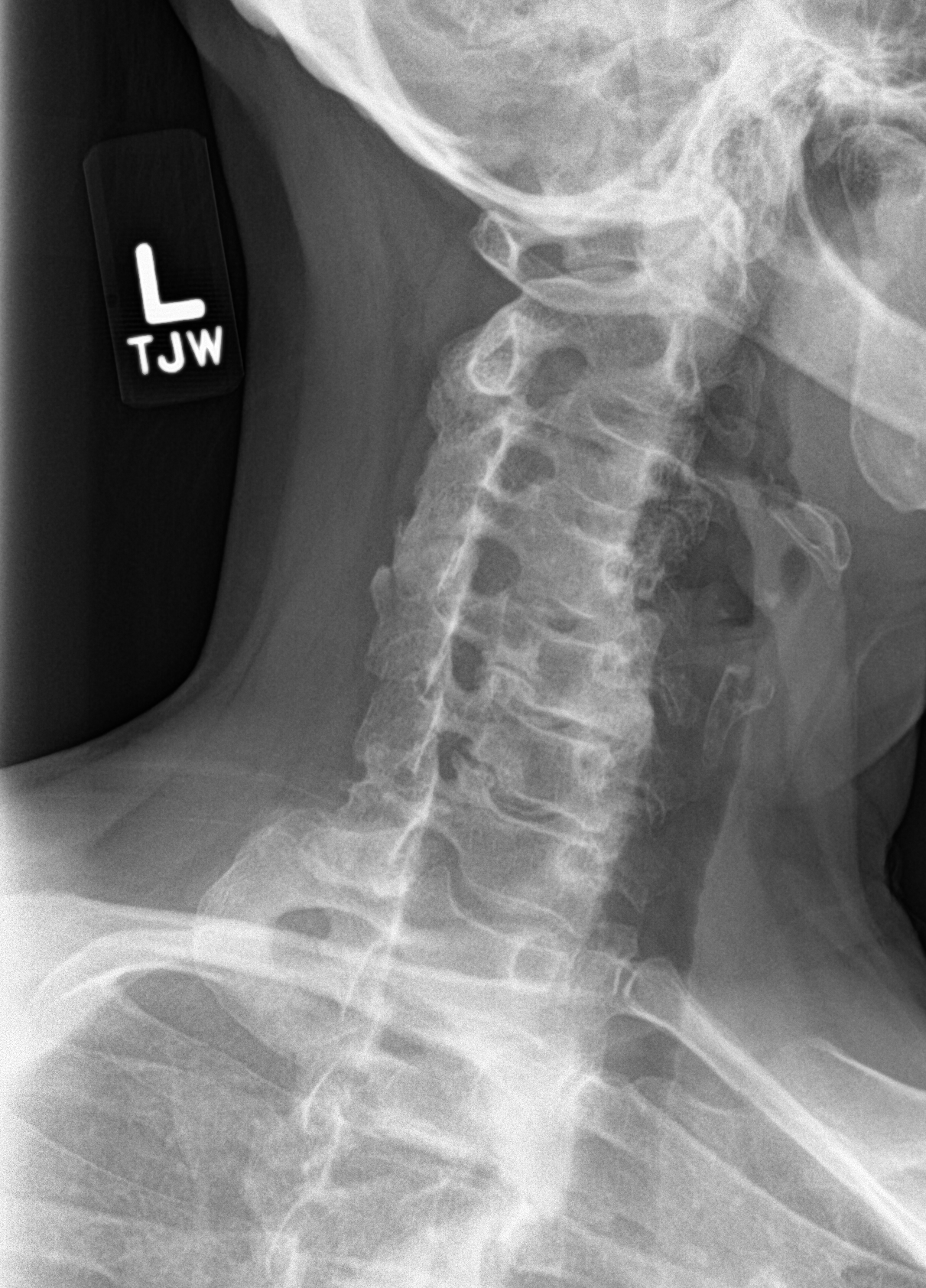

[c-spine obl (2 of 2)]
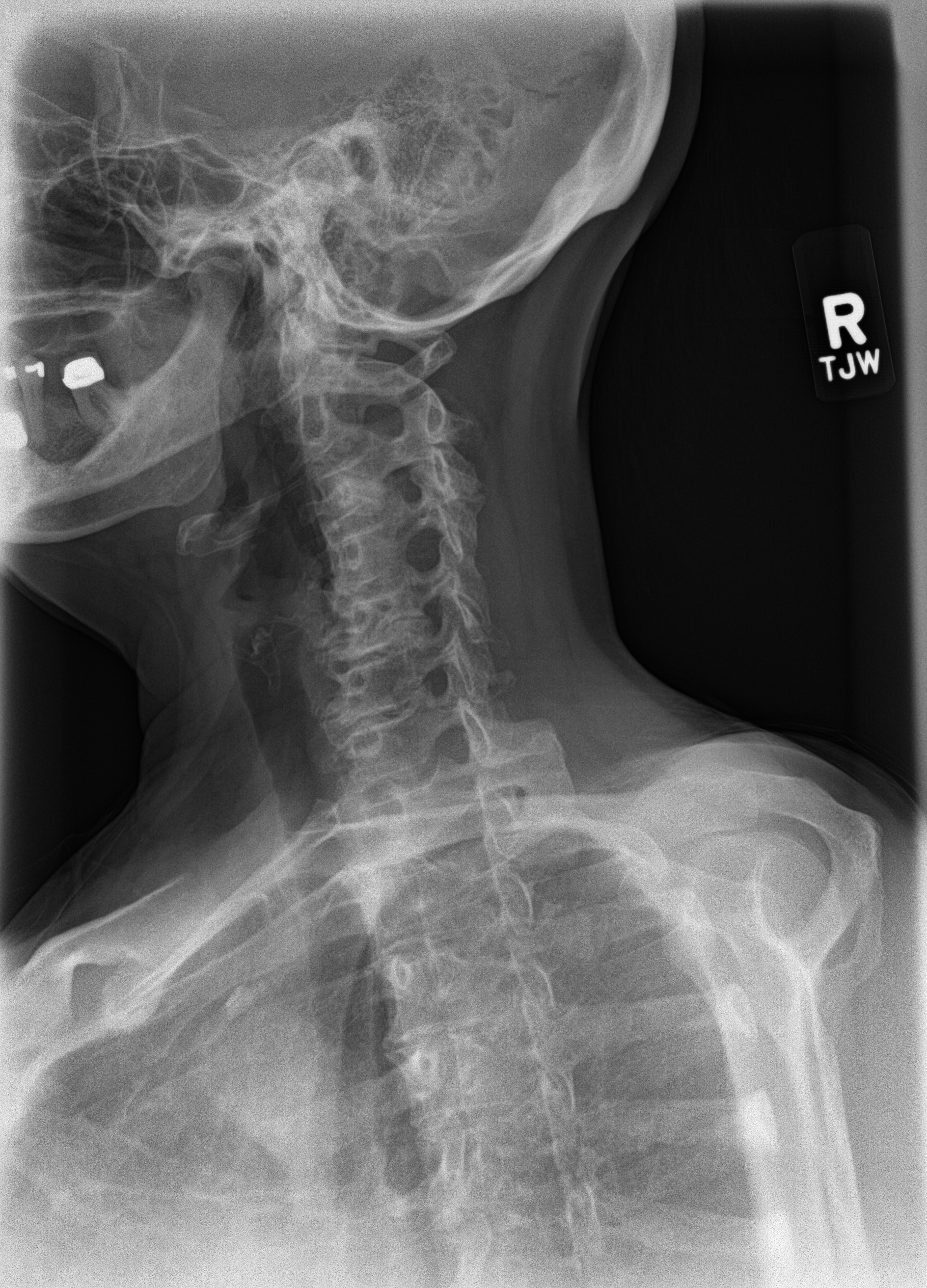

[c-spine ap]
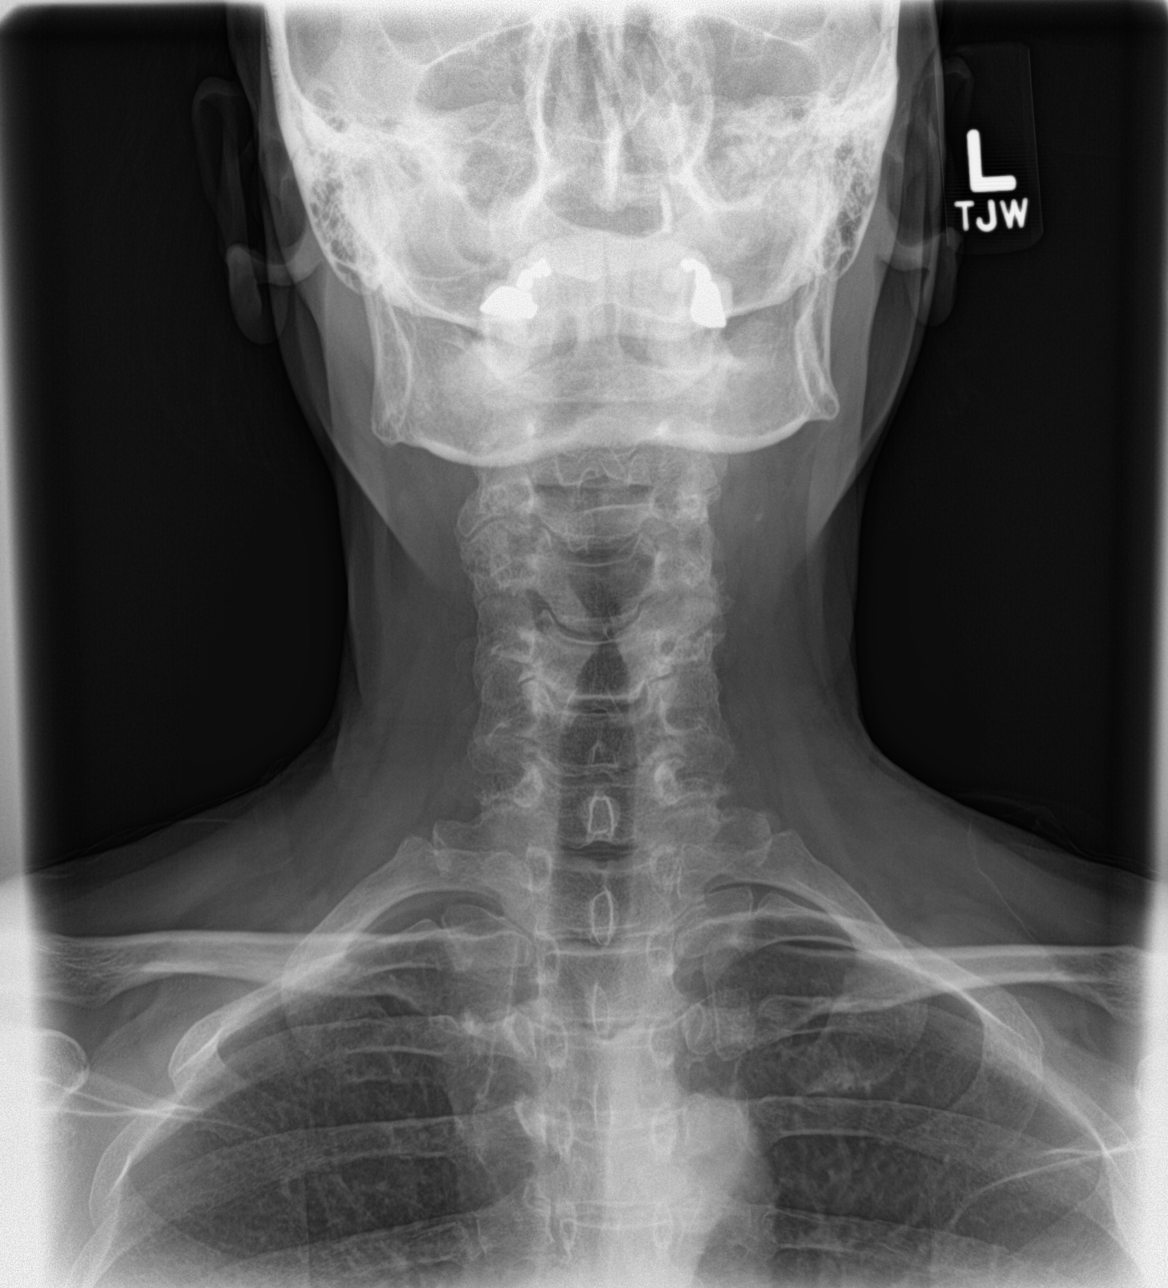

[c-spine open mouth]
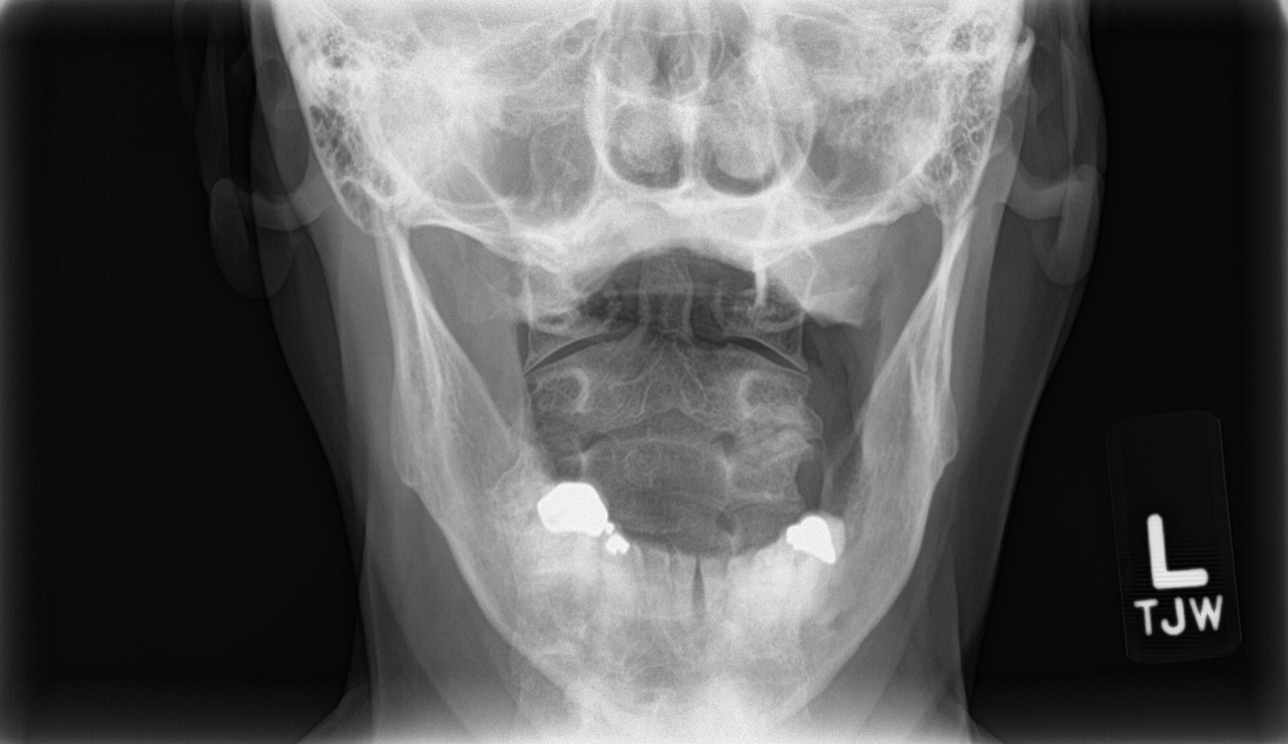

[5 of 5 positions shown; findings below may reference images not displayed]

FINDINGS: Degenerative disc disease at C5-6 and C6-7 with disc space narrowing
and spurring. Degenerative facet disease bilaterally. Uncovertebral
spurring causes right neural foraminal narrowing at C5-6 and C6-7
and left neural foraminal narrowing at C5-6. No fracture.
Prevertebral soft tissues are normal.
IMPRESSION: Degenerative changes as above.  No acute findings.

## 2024-02-07 ENCOUNTER — Ambulatory Visit: Payer: Self-pay

## 2024-02-07 NOTE — Telephone Encounter (Signed)
 Chief Complaint: right leg wound Symptoms: oozing/painful/red and white raised wound to right leg Frequency: x 5 months Pertinent Negatives: Patient denies fever, chills, rash on other parts of body. Disposition: [] ED /[x] Urgent Care (no appt availability in office) / [] Appointment(In office/virtual)/ []  [facility]/ [] Home Care/ [] Refused Recommended Disposition /[] [facility]/ []  Follow-up with PCP Additional Notes: Patient states she had a mole on her medial side of her right leg near the thigh that she thinks is cancerous. She states due to insurance issues she has not sought medical care until now. Patient states she needs to get in a PCP as soon as possible to get a oncology referral. She is calling today because her new patient appointment was cancelled by the office. Patient scheduled for new patient appointment on 02/26/24 with Mordecai Maes NP. Advised to go to urgent care or ED within 24 hours for evaluation. Patient states her daughter is getting married so she is busy this weekend and will likely go on Monday. Educated patient on need to go to urgent care today or tomorrow due to signs of infection. She states she also has moles on her neck and breast she is concerned about being cancerous. Patient provided with address/location of  urgent care and states she will consider going today.  Copied from CRM [id number]. Topic: Clinical - Red Word Triage >> Feb 07, 2024 10:56 AM Florestine Avers wrote: Red Word that prompted transfer to Nurse Triage: Patient called in stating that her May 8th appointment was cancelled, she waited over a month to schedule that appointment and the next available is not until June. Patient has a tumor on her leg she really would like looked at and is requesting a sooner appointment. Reason for Disposition  [1] Pus or cloudy fluid draining from wound AND [2] no fever  Answer Assessment - Initial Assessment Questions 1. LOCATION: "Where is the  wound located?"      Right leg about 5-6 inches from groin, medial side.  2. WOUND APPEARANCE: "What does the wound look like?"      She states it will drain or ooze, sometimes with blood. She states she is able to stop the bleeding.   3. SIZE: If redness is present, ask: "What is the size of the red area?" (Inches, centimeters, or compare to size of a coin)      Yes, she states it looks like a cauliflower with white and red. She states the size of the wound is about a silver dollar.   4. SPREAD: "What's changed in the last day?"  "Do you see any red streaks coming from the wound?"     She states in the last 3 days there has been more of a shooting pain and increased foul odor drainage.  5. ONSET: "When did it start to look infected?"      X 5 months.  6. MECHANISM: "How did the wound start, what was the cause?"     She states it started as a mole and she thinks it is a tumor.  7. PAIN: "Is there any pain?" If Yes, ask: "How bad is the pain?"   (Scale 1-10; or mild, moderate, severe)     4-6/10, she states she treats with tylenol and Aleeve. Last Aleeve was taken 40 minutes ago.  8. FEVER: "Do you have a fever?" If Yes, ask: "What is your temperature, how was it measured, and when did it start?"    Denies.  9. OTHER  SYMPTOMS: "Do you have any other symptoms?" (e.g., shaking chills, weakness, rash elsewhere on body)     She states it is difficult to stand on her right leg very long due to the wound.  10. PREGNANCY: "Is there any chance you are pregnant?" "When was your last menstrual period?"       N/A.  Protocols used: Wound Infection-A-AH

## 2024-02-26 ENCOUNTER — Ambulatory Visit (INDEPENDENT_AMBULATORY_CARE_PROVIDER_SITE_OTHER): Admitting: Nurse Practitioner

## 2024-02-26 ENCOUNTER — Encounter: Payer: Self-pay | Admitting: Nurse Practitioner

## 2024-02-26 VITALS — BP 160/100 | HR 100 | Temp 98.3°F | Ht 62.0 in | Wt 130.0 lb

## 2024-02-26 DIAGNOSIS — F32A Depression, unspecified: Secondary | ICD-10-CM

## 2024-02-26 DIAGNOSIS — F419 Anxiety disorder, unspecified: Secondary | ICD-10-CM | POA: Diagnosis not present

## 2024-02-26 DIAGNOSIS — Z7689 Persons encountering health services in other specified circumstances: Secondary | ICD-10-CM | POA: Diagnosis not present

## 2024-02-26 DIAGNOSIS — I10 Essential (primary) hypertension: Secondary | ICD-10-CM | POA: Insufficient documentation

## 2024-02-26 DIAGNOSIS — L989 Disorder of the skin and subcutaneous tissue, unspecified: Secondary | ICD-10-CM | POA: Diagnosis not present

## 2024-02-26 DIAGNOSIS — G4700 Insomnia, unspecified: Secondary | ICD-10-CM

## 2024-02-26 NOTE — Assessment & Plan Note (Signed)
 Large skin lesion that will likely need to be removed with general surgery.  Amatory referral placed urgently.  Clinical photo in chart.

## 2024-02-26 NOTE — Assessment & Plan Note (Signed)
 Currently managed on otc melatonin with some relief

## 2024-02-26 NOTE — Patient Instructions (Signed)
 Nice to see you today Please establish with a doctor that is in network I have referred you [facility]

## 2024-02-26 NOTE — Assessment & Plan Note (Signed)
 History of same.  Patient declines medication at this juncture.  Did encourage patient to follow-up with in network primary care provider to handle her chronic health needs.

## 2024-02-26 NOTE — Assessment & Plan Note (Signed)
 History of the same. She declines treatment at this time.

## 2024-02-26 NOTE — Progress Notes (Signed)
 New Patient Office Visit  Subjective    Patient ID: Julia Nichols, female    DOB: Aug 26, 1961  Age: 63 y.o. MRN: [id number]  CC:  Chief Complaint  Patient presents with   Establish Care    Pt complains of having a tumor on right leg. States she need a referral to have the tumor removed. Pt prefers [facility] due to insurance plan    HPI Julia Nichols presents to establish care   Anxiety: not on any medications currently. States that she has anxiety to the point 2-3 times a week. States that she does not get out much since covid. But when she does she gets anxiety. States that she has had trouble sleeping. States that she has tried prozac without relirf and valium. She has tried melatonin that will get her alseep but not stay asleep    Wound: states that it started as a mole and then grew. States that it will come back. States that it smells and drains clear to yellow color. It does hurt. States that it can itchs. Keeps a bandage over it. States that it grew then fell off and came back appox 7 months.   Pap Smear: over due   Mammogram: when she is 50.  Deferred today  Colonoscopy deferred today  Tdap: 2014 Flu: refused  Covid: refused    Outpatient Encounter Medications as of 02/26/2024  Medication Sig   aspirin 81 MG tablet Take 81 mg by mouth daily.   albuterol (PROVENTIL HFA;VENTOLIN HFA) 108 (90 Base) MCG/ACT inhaler Inhale 2 puffs into the lungs every 4 (four) hours as needed for shortness of breath.   Multiple Vitamins-Minerals (CENTRUM SILVER PO) Take 1 tablet by mouth. (Patient not taking: Reported on 02/26/2024)   [DISCONTINUED] benzonatate (TESSALON) 200 MG capsule Take 1 capsule (200 mg total) by mouth 3 (three) times daily as needed for cough.   No facility-administered encounter medications on file as of 02/26/2024.    Past Medical History:  Diagnosis Date   Anxiety    Hypertension    Mitral valve problem     No past surgical history on  file.  Family History  Problem Relation Age of Onset   Cancer Mother        skin   Alzheimer's disease Mother    Cancer Father    Parkinson's disease Father    Cancer Maternal Aunt        skin    Social History   Socioeconomic History   Marital status: Divorced    Spouse name: Not on file   Number of children: 2   Years of education: Not on file   Highest education level: Some college, no degree  Occupational History   Not on file  Tobacco Use   Smoking status: Every Day    Current packs/day: 1.00    Average packs/day: 1 pack/day for 48.0 years (48.0 ttl pk-yrs)    Types: Cigarettes    Start date: 02/26/1976   Smokeless tobacco: Never  Vaping Use   Vaping status: Former  Substance and Sexual Activity   Alcohol use: Not Currently    Comment: occasionally   Drug use: No   Sexual activity: Yes    Birth control/protection: None  Other Topics Concern   Not on file  Social History Narrative   Retired         Psychologist, 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37)   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31)   Social Drivers of Corporate investment banker Strain: Low Risk  (  02/20/2024)   Overall Financial Resource Strain (CARDIA)    Difficulty of Paying Living Expenses: Not very hard  Food Insecurity: No Food Insecurity (02/20/2024)   Hunger Vital Sign    Worried About Running Out of Food in the Last Year: Never true    Ran Out of Food in the Last Year: Never true  Transportation Needs: No Transportation Needs (02/20/2024)   PRAPARE - Administrator, Civil Service (Medical): No    Lack of Transportation (Non-Medical): No  Physical Activity: Unknown (02/20/2024)   Exercise Vital Sign    Days of Exercise per Week: 0 days    Minutes of Exercise per Session: Not on file  Stress: Stress Concern Present (02/20/2024)   [employer] of Occupational Health - Occupational Stress Questionnaire    Feeling of Stress : Very much  Social Connections: Moderately Integrated (02/20/2024)   Social Connection and Isolation Panel [NHANES]     Frequency of Communication with Friends and Family: Three times a week    Frequency of Social Gatherings with Friends and Family: Once a week    Attends Religious Services: 1 to 4 times per year    Active Member of [employer] or Organizations: Yes    Attends Banker Meetings: 1 to 4 times per year    Marital Status: Divorced  Catering manager Violence: Not on file    Review of Systems  Constitutional:  Positive for chills. Negative for fever.  Respiratory:  Negative for shortness of breath.   Cardiovascular:  Negative for chest pain.  Neurological:  Positive for dizziness. Negative for headaches.  Psychiatric/Behavioral:  Negative for hallucinations and suicidal ideas.         Objective    BP (!) 160/100   Pulse 100   Temp 98.3 F (36.8 C) (Oral)   Ht 5\' 2"  (1.575 m)   Wt 130 lb (59 kg)   SpO2 97%   BMI 23.78 kg/m   Physical Exam Vitals and nursing note reviewed.  Constitutional:      Appearance: Normal appearance.  Cardiovascular:     Rate and Rhythm: Normal rate and regular rhythm.     Heart sounds: Normal heart sounds.  Pulmonary:     Effort: Pulmonary effort is normal.     Breath sounds: Normal breath sounds.  Skin:    Findings: Lesion present.  Neurological:     Mental Status: She is alert.            Assessment & Plan:   Problem List Items Addressed This Visit       Cardiovascular and Mediastinum   Primary hypertension   History of same.  Patient declines medication at this juncture.  Did encourage patient to follow-up with in network primary care provider to handle her chronic health needs.        Musculoskeletal and Integument   Skin lesion - Primary   Large skin lesion that will likely need to be removed with general surgery.  Amatory referral placed urgently.  Clinical photo in chart.      Relevant Orders   Ambulatory referral to General Surgery     Other   Anxiety and depression   History of the same. She declines  treatment at this time.      Insomnia   Currently managed on otc melatonin with some relief       Encounter to establish care with new doctor   Started going over health maintenance with patient.  She is  out of network with our clinic and would like to defer this for now.  Encouraged her to establish care with in-network provider for healthcare maintenance and chronic medical needs       Return if symptoms worsen or fail to improve.   Margarie Shay, NP

## 2024-02-26 NOTE — Assessment & Plan Note (Signed)
 Started going over health maintenance with patient.  She is out of network with our clinic and would like to defer this for now.  Encouraged her to establish care with in-network provider for healthcare maintenance and chronic medical needs

## 2024-03-14 ENCOUNTER — Telehealth: Payer: Self-pay | Admitting: Nurse Practitioner

## 2024-03-14 NOTE — Telephone Encounter (Signed)
 Contacted bionca from [facility] to confirm Referral status.  Bionca was unable to find patient in their system.  Transferred to speak with Referral department at [facility]  Left a VM for Referral team to call the office back regarding authorized referral for skin lesion.

## 2024-03-14 NOTE — Telephone Encounter (Signed)
 Copied from CRM [id number]. Topic: Referral - Question >> Mar 14, 2024 10:34 AM Kita Perish H wrote: Reason for CRM: Patient is calling stating she reached out to [facility] to schedule and was told they don't have the referral, referral in system as of 4/16 and is authorized.   Chris [phone]

## 2024-03-17 NOTE — Telephone Encounter (Signed)
 Left a VM for joyce from [facility] referral team.

## 2024-03-18 ENCOUNTER — Telehealth: Payer: Self-pay

## 2024-03-18 NOTE — Telephone Encounter (Signed)
 Contacted pt to give an update on referral for skin lesion to [facility] [place] [facility].   Contacted [facility] and spoke with bionca.  Bionca states that she will get the referral set up and contact the patient.

## 2024-03-18 NOTE — Telephone Encounter (Signed)
 Copied from CRM [id number]. Topic: Referral - Status >> Mar 18, 2024  2:00 PM Caliyah H wrote: Reason for CRM: Patient called to follow up on referral status.She stated it has been three weeks and she has not received any communication regarding the status or next steps. After reviewing the chart, I informed the patient that the referral is pending a PA. Patient expressed frustration and stated she does not mind paying the $140 if that will allow her to proceed with the procedure. She is requesting a callback for clarification and to discuss the next steps moving forward.

## 2024-03-18 NOTE — Telephone Encounter (Signed)
 Lenon Radar from [facility] returned call regarding pt referral for skin lesion.  Joyce stated that she will give a patient a call to schedule an appointment to be seen.  She says that she was not able to find in their scheduler but is able to look through epic to find the referral.

## 2024-03-18 NOTE — Telephone Encounter (Signed)
 [facility] does not accept pt insurance.  Pt requests for skin lesion surgery done in [facility] in either [place], [place], or [place].  Pt states that she just wants to get this done and does not care where its located as long as insurance is covering.

## 2024-03-19 ENCOUNTER — Telehealth: Payer: Self-pay | Admitting: Nurse Practitioner

## 2024-03-19 NOTE — Telephone Encounter (Signed)
 Copied from CRM [id number]. Topic: Referral - Status >> Mar 18, 2024  4:24 PM Juleen Oakland F wrote: Reason for CRM: [facility] called to let Margarie Shay know that they are not in network with patients insurance and to refer her else where - patient is aware

## 2024-03-19 NOTE — Telephone Encounter (Signed)
 The referral states [facility] in [place]. If [facility] does not have a office in [place] then came we get her to the closest one?

## 2024-03-20 ENCOUNTER — Ambulatory Visit: Admitting: Family Medicine

## 2024-03-20 NOTE — Telephone Encounter (Signed)
 Duplicate message - see other 03/18/24 telephone encounter

## 2024-03-21 ENCOUNTER — Telehealth: Payer: Self-pay | Admitting: Nurse Practitioner

## 2024-03-21 NOTE — Telephone Encounter (Signed)
 Copied from CRM [id number]. Topic: Referral - Question >> Mar 21, 2024  9:13 AM Julia Nichols wrote: Reason for CRM: Patient called in regarding the referral, wanting to know who the referral was sent to and if someone could give her a call or a send a message through [facility] regarding this

## 2024-03-21 NOTE — Telephone Encounter (Signed)
 Spoke with pt notifying her the referral is with [facility]. Pt states she was contacted by them but found out they are not in her network. Pt is asking to scheduled with someone in-network as soon as possible.

## 2024-03-24 NOTE — Telephone Encounter (Signed)
 Contacted pt regarding referral location.  Pt states that she has an appointment with [facility] in [place].  Will call back with any updates or concerns regarding appointment for Skin Lesion.

## 2024-03-25 NOTE — Telephone Encounter (Signed)
Noted  Referral updated
